# Patient Record
Sex: Male | Born: 1980 | Hispanic: Yes | Marital: Single | Smoking: Never smoker
Health system: Southern US, Community
[De-identification: ages and names within clinical notes are randomized; demographics above are authoritative.]

---

## 2017-09-02 ENCOUNTER — Emergency Department (HOSPITAL_COMMUNITY)
Admission: EM | Admit: 2017-09-02 | Discharge: 2017-09-02 | Disposition: A | Discharge status: Home / Self Care | Payer: Self-pay | Attending: Emergency Medicine | Admitting: Emergency Medicine

## 2017-09-02 ENCOUNTER — Encounter (HOSPITAL_COMMUNITY): Payer: Self-pay | Admitting: Cardiology

## 2017-09-02 ENCOUNTER — Emergency Department (HOSPITAL_COMMUNITY): Payer: Self-pay

## 2017-09-02 DIAGNOSIS — K2950 Unspecified chronic gastritis without bleeding: Secondary | ICD-10-CM | POA: Insufficient documentation

## 2017-09-02 LAB — CBC
HEMATOCRIT: 44.1 % (ref 39.0–52.0)
Hemoglobin: 14.6 g/dL (ref 13.0–17.0)
MCH: 31.3 pg (ref 26.0–34.0)
MCHC: 33.1 g/dL (ref 30.0–36.0)
MCV: 94.4 fL (ref 78.0–100.0)
Platelets: 204 10*3/uL (ref 150–400)
RBC: 4.67 MIL/uL (ref 4.22–5.81)
RDW: 12.6 % (ref 11.5–15.5)
WBC: 6 10*3/uL (ref 4.0–10.5)

## 2017-09-02 LAB — COMPREHENSIVE METABOLIC PANEL
ALBUMIN: 4.6 g/dL (ref 3.5–5.0)
ALT: 29 U/L (ref 17–63)
AST: 26 U/L (ref 15–41)
Alkaline Phosphatase: 70 U/L (ref 38–126)
Anion gap: 13 (ref 5–15)
BILIRUBIN TOTAL: 0.8 mg/dL (ref 0.3–1.2)
BUN: 17 mg/dL (ref 6–20)
CHLORIDE: 94 mmol/L — AB (ref 101–111)
CO2: 24 mmol/L (ref 22–32)
Calcium: 9.2 mg/dL (ref 8.9–10.3)
Creatinine, Ser: 0.95 mg/dL (ref 0.61–1.24)
GFR calc Af Amer: >60 mL/min (ref >60)
GFR calc non Af Amer: >60 mL/min (ref >60)
GLUCOSE: 124 mg/dL — AB (ref 65–99)
POTASSIUM: 3.8 mmol/L (ref 3.5–5.1)
Sodium: 131 mmol/L — ABNORMAL LOW (ref 135–145)
TOTAL PROTEIN: 7.8 g/dL (ref 6.5–8.1)

## 2017-09-02 LAB — URINALYSIS, ROUTINE W REFLEX MICROSCOPIC
Bilirubin Urine: NEGATIVE
Glucose, UA: 50 mg/dL — AB
Hgb urine dipstick: NEGATIVE
Ketones, ur: 20 mg/dL — AB
Leukocytes, UA: NEGATIVE
NITRITE: NEGATIVE
PH: 5 (ref 5.0–8.0)
Protein, ur: NEGATIVE mg/dL
SPECIFIC GRAVITY, URINE: 1.025 (ref 1.005–1.030)

## 2017-09-02 LAB — LIPASE, BLOOD: LIPASE: 28 U/L (ref 11–51)

## 2017-09-02 MED ORDER — ONDANSETRON HCL 4 MG/2ML IJ SOLN
4.0000 mg | Freq: Once | INTRAMUSCULAR | Status: AC
  Administered 2017-09-02: 4 mg via INTRAVENOUS
  Filled 2017-09-02: qty 2

## 2017-09-02 MED ORDER — IOPAMIDOL (ISOVUE-300) INJECTION 61%
100.0000 mL | Freq: Once | INTRAVENOUS | Status: AC | PRN
  Administered 2017-09-02: 100 mL via INTRAVENOUS

## 2017-09-02 MED ORDER — RANITIDINE HCL 150 MG PO TABS: 150.0000 mg | ORAL_TABLET | Freq: Two times a day (BID) | ORAL | 0 refills | Status: AC

## 2017-09-02 MED ORDER — PANTOPRAZOLE SODIUM 40 MG PO TBEC
40.0000 mg | DELAYED_RELEASE_TABLET | Freq: Once | ORAL | Status: AC
  Administered 2017-09-02: 40 mg via ORAL
  Filled 2017-09-02: qty 1

## 2017-09-02 MED ORDER — FAMOTIDINE 20 MG PO TABS
20.0000 mg | ORAL_TABLET | Freq: Once | ORAL | Status: AC
  Administered 2017-09-02: 20 mg via ORAL
  Filled 2017-09-02: qty 1

## 2017-09-02 MED ORDER — SODIUM CHLORIDE 0.9 % IV SOLN
1000.0000 mL | INTRAVENOUS | Status: DC
  Administered 2017-09-02: 1000 mL via INTRAVENOUS

## 2017-09-02 MED ORDER — ESOMEPRAZOLE MAGNESIUM 40 MG PO CPDR: 40.0000 mg | DELAYED_RELEASE_CAPSULE | Freq: Every day | ORAL | 0 refills | Status: AC

## 2017-09-02 MED ORDER — SODIUM CHLORIDE 0.9 % IV BOLUS (SEPSIS)
1000.0000 mL | Freq: Once | INTRAVENOUS | Status: AC
  Administered 2017-09-02: 1000 mL via INTRAVENOUS

## 2017-09-02 NOTE — Discharge Instructions (Signed)
Your urine test reveals some sugar in your urine.  Please see the your primary physician, [facility] will [facility] to have [facility] ret[facility].  The CT scan of your abdomen suggest irritation of the stomach lining, possibly consistent with gastritis.  Please use Zantac 150 mg 2 times daily.  Use Nexium 40mg  daily until seen by the stomach specialist.(Both of these medications are available over-the-counter in generic form)

## 2017-09-02 NOTE — ED Notes (Signed)
Pt states his pain is the same.  States his nausea is better.

## 2017-09-02 NOTE — ED Provider Notes (Signed)
[facility] EMERGENCY DEPARTMENT Provider Note   CSN: [id number] Arrival date & time: 09/02/17  1514     History   Chief Complaint Chief Complaint  Patient presents with  . Abdominal Pain    HPI Prabhav Faulkenberry is a 37 y.o. male.  Patient is a 37 year old male who presents to the emergency department with a complaint of abdominal pain and vomiting.  The patient states that he was awakened this morning about 4 AM with mid abdomen area pain.  He went to work, but continued to have pain.  He tried Alka-Seltzer, as well as ibuprofen but continued to have problems with pain.  He tried getting a little bit of soda to stay down, but it would not stay down.  He states that he has had probably 3 or 4 episodes of vomiting.  He also feels that he has had fever and chills, but he is unsure as to how much with temperature change she has had.  He is not had any recent changes in his diet.  He is not had any changes in his medicine.  He is not been out of the country recently.  He does state that his baby is sick, but no one else at home has been ill recently.      History reviewed. No pertinent past medical history.  There are no active problems to display for this patient.   History reviewed. No pertinent surgical history.     Home Medications    Prior to Admission medications   Not on File    Family History History reviewed. No pertinent family history.  Social History Social History   Tobacco Use  . Smoking status: Never Smoker  . Smokeless tobacco: Never Used  Substance Use Topics  . Alcohol use: Yes    Frequency: Never    Comment: occasional   . Drug use: No     Allergies   Patient has no known allergies.   Review of Systems Review of Systems  Constitutional: Positive for activity change, appetite change, chills, fatigue and fever.       All ROS Neg except as noted in HPI  HENT: Negative for nosebleeds.   Eyes: Negative for photophobia and discharge.   Respiratory: Negative for cough, shortness of breath and wheezing.   Cardiovascular: Negative for chest pain and palpitations.  Gastrointestinal: Positive for abdominal pain, nausea and vomiting. Negative for blood in stool and diarrhea.  Genitourinary: Negative for dysuria, frequency and hematuria.  Musculoskeletal: Negative for arthralgias, back pain and neck pain.  Skin: Negative.   Neurological: Negative for dizziness, seizures and speech difficulty.  Psychiatric/Behavioral: Negative for confusion and hallucinations.     Physical Exam Updated Vital Signs BP 127/80   Pulse (!) 56   Temp 98.3 F (36.8 C) (Oral)   Resp 18   Wt 69.9 kg (154 lb)   SpO2 99%   Physical Exam  Constitutional: He is oriented to person, place, and time. Vital signs are normal. He appears well-developed and well-nourished. He is active.  Non-toxic appearance.  HENT:  Head: Normocephalic and atraumatic.  Right Ear: Tympanic membrane, external ear and ear canal normal.  Left Ear: Tympanic membrane, external ear and ear canal normal.  Nose: Nose normal.  Mouth/Throat: Uvula is midline, oropharynx is clear and moist and mucous membranes are normal.  Eyes: Conjunctivae, EOM and lids are normal. Pupils are equal, round, and reactive to light.  Neck: Trachea normal, normal range of motion and phonation normal.  Neck supple. Carotid bruit is not present.  Cardiovascular: Normal rate, regular rhythm, normal heart sounds, intact distal pulses and normal pulses.  Pulmonary/Chest: Breath sounds normal. No respiratory distress.  Abdominal: Soft. Normal appearance and bowel sounds are normal. He exhibits no distension, no ascites and no mass. There is no splenomegaly or hepatomegaly. There is generalized tenderness. There is no rigidity, no guarding, no CVA tenderness, no tenderness at McBurney's point and negative Murphy's sign.  Musculoskeletal: Normal range of motion.  Lymphadenopathy:       Head (right side): No  submental, no submandibular, no preauricular and no posterior auricular adenopathy present.       Head (left side): No submental, no submandibular, no preauricular and no posterior auricular adenopathy present.    He has no cervical adenopathy.  Neurological: He is alert and oriented to person, place, and time. He has normal strength. No cranial nerve deficit or sensory deficit. GCS eye subscore is 4. GCS verbal subscore is 5. GCS motor subscore is 6.  Skin: Skin is warm and dry.  Psychiatric: He has a normal mood and affect. His speech is normal.  Nursing note and vitals reviewed.    ED Treatments / Results  Labs (all labs ordered are listed, but only abnormal results are displayed) Labs Reviewed  COMPREHENSIVE METABOLIC PANEL - Abnormal; Notable for the following components:      Result Value   Sodium 131 (*)    Chloride 94 (*)    Glucose, Bld 124 (*)    All other components within normal limits  LIPASE, BLOOD  CBC  URINALYSIS, ROUTINE W REFLEX MICROSCOPIC    EKG  EKG Interpretation None       Radiology No results found.  Procedures Procedures (including critical care time)  Medications Ordered in ED Medications  sodium chloride 0.9 % bolus 1,000 mL (not administered)    Followed by  0.9 %  sodium chloride infusion (not administered)  ondansetron (ZOFRAN) injection 4 mg (not administered)     Initial Impression / Assessment and Plan / ED Course  I have reviewed the triage vital signs and the nursing notes.  Pertinent labs & imaging results that were available during my care of the patient were reviewed by me and considered in my medical decision making (see chart for details).       Final Clinical Impressions(s) / ED Diagnoses MDM  Vital signs are within normal limits.   Patient has been unable to keep liquids down today.  IV fluids and IV Zofran ordered.  Lipase is normal.  Liver function tests are normal.  Renal function test are normal.  Sodium is  slightly low at 131 and chloride is slightly low at 94.  Anion gap is normal at 13.  Complete blood count is well within normal limits.  Urinalysis shows a clear yellow specimen with a specific gravity 1.025.  The glucose is elevated at 50, ketones elevated at 20, otherwise the urine is within normal limits.  I have asked the patient to see his primary physician for recheck of his glucose and his urine.  Patient states he does not have a history of diabetes.  Patient states that his nausea feels better, but he continues to have epigastric area pain.  Recheck shows that he now has pain to palpation in the epigastric area.  Will check a CT scan to rule out causes of abdominal pain including abnormality of the pancreas, gastritis,  obstruction, mass, signs of colitis, or surgical abdomen.  CT scan shows some thickening of the gastric wall consistent with gastritis.  Patient will be treated with Zantac and Nexium.  The patient states that he smokes occasionally and he uses alcohol on a regular basis.  I have asked him to stop both of these to help with his abdominal pain.  Patient is also referred to GI specialist for evaluation if not improving.  Patient is in agreement with this plan.   Final diagnoses:  Chronic gastritis without bleeding, unspecified gastritis type    ED Discharge Orders        Ordered    ranitidine (ZANTAC) 150 MG tablet  2 times daily     09/02/17 2222    esomeprazole (NEXIUM) 40 MG capsule  Daily     09/02/17 2222       Ivery Quale, PA-C 09/02/17 2228    Doug Sou, MD 09/02/17 2330

## 2017-09-02 NOTE — ED Triage Notes (Signed)
Vomiting and upper abdominal pain since this morning

## 2017-09-24 ENCOUNTER — Ambulatory Visit
Admission: RE | Admit: 2017-09-24 | Discharge: 2017-09-24 | Disposition: A | Discharge status: Home / Self Care | Payer: Worker's Compensation | Source: Ambulatory Visit | Attending: Physician Assistant | Admitting: Physician Assistant

## 2017-09-24 ENCOUNTER — Other Ambulatory Visit: Payer: Self-pay | Admitting: Physician Assistant

## 2017-09-24 DIAGNOSIS — S61012A Laceration without foreign body of left thumb without damage to nail, initial encounter: Secondary | ICD-10-CM | POA: Diagnosis not present

## 2017-09-24 DIAGNOSIS — W3189XA Contact with other specified machinery, initial encounter: Secondary | ICD-10-CM | POA: Insufficient documentation

## 2017-09-24 DIAGNOSIS — M79645 Pain in left finger(s): Secondary | ICD-10-CM | POA: Insufficient documentation

## 2018-08-31 IMAGING — CT CT ABD-PELV W/ CM
2 of 4 series · 16 of 46 positions shown, 18 images · IV contrast (Isovue)
Comparison: None.

CLINICAL DATA: Epigastric pain today.  Vomiting.

EXAM:
CT ABDOMEN AND PELVIS WITH CONTRAST
TECHNIQUE: Multidetector CT imaging of the abdomen and pelvis was performed
using the standard protocol following bolus administration of
intravenous contrast.
CONTRAST:  100mL 57W5P1-MJJ IOPAMIDOL (57W5P1-MJJ) INJECTION 61%

[Series 2: axial st · axial · 0.59mm/px · z∈[+757,+1172]mm · 13 of 91 slices shown, 15 images]
[im 4/91  soft-tissue]
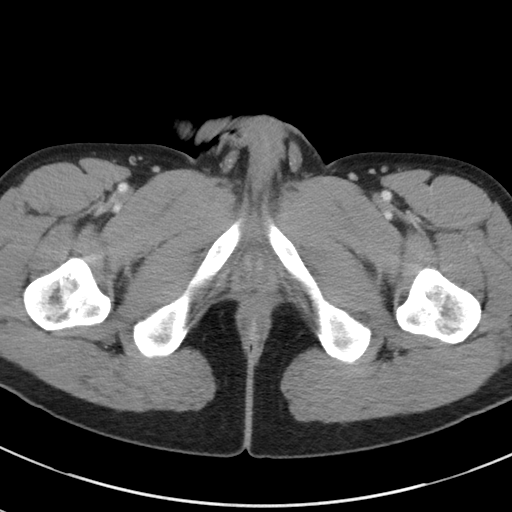
[im 4/91  bone]
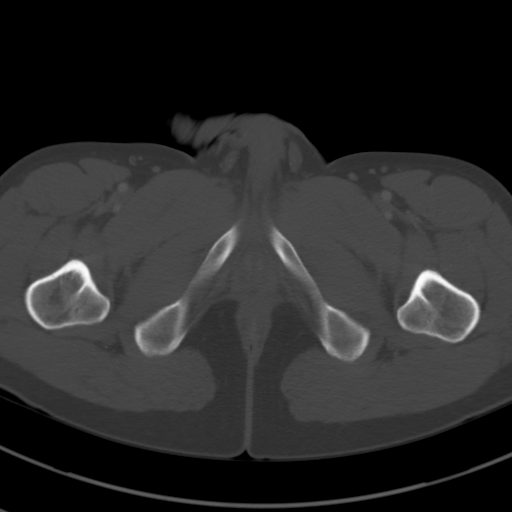
[im 12/91  soft-tissue]
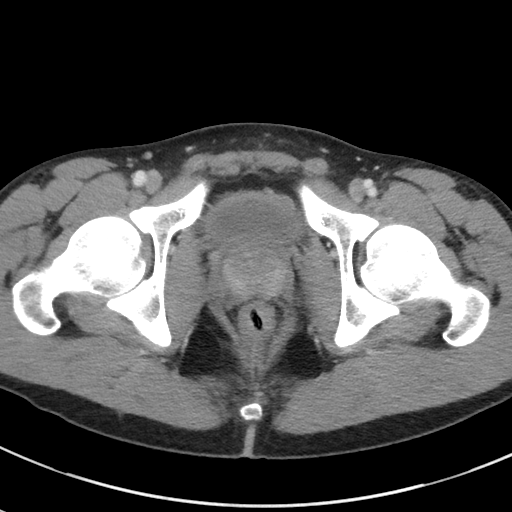
[im 19/91  soft-tissue]
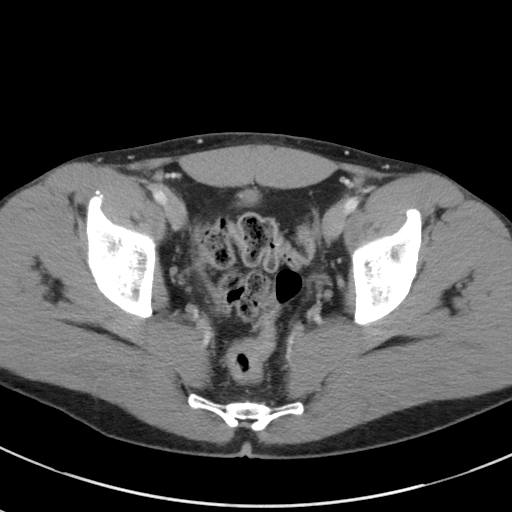
[im 27/91  soft-tissue]
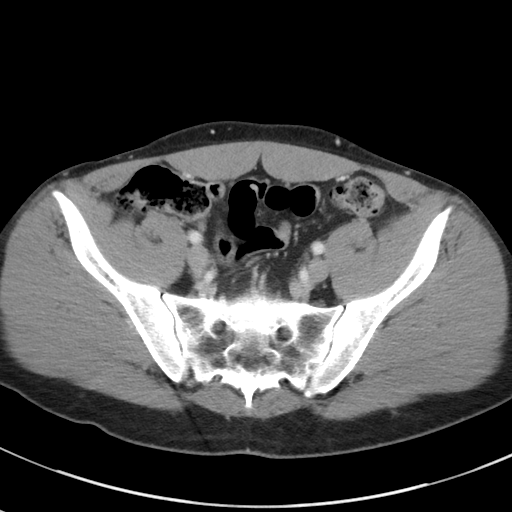
[im 31/91  soft-tissue]
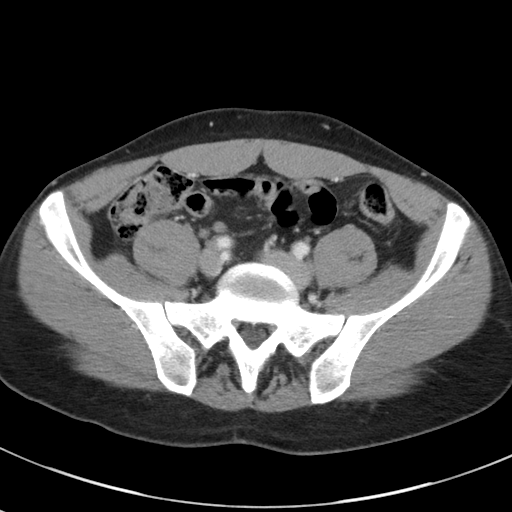
[im 38/91  soft-tissue]
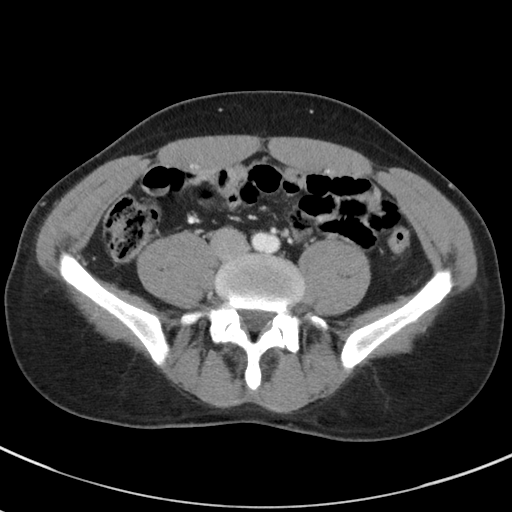
[im 46/91  soft-tissue]
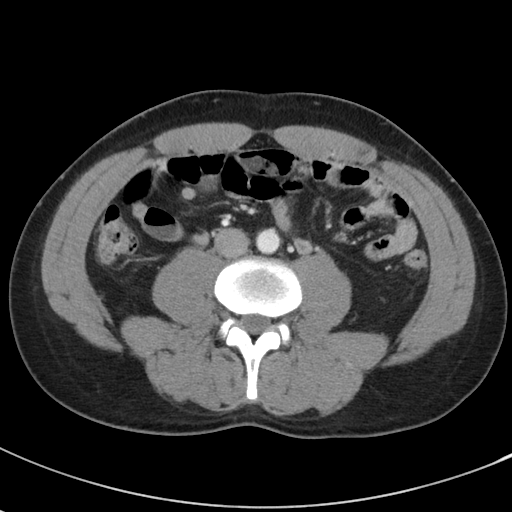
[im 53/91  soft-tissue]
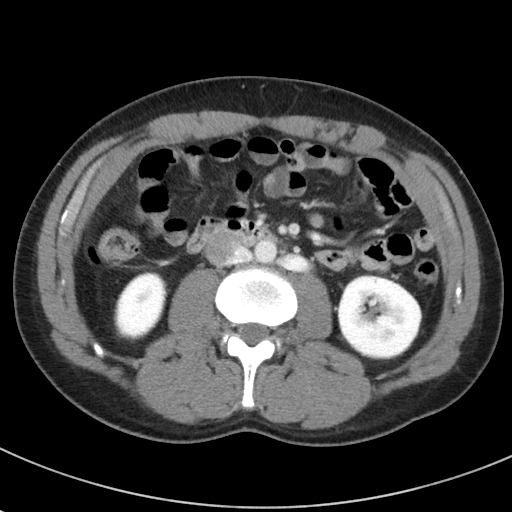
[im 61/91  soft-tissue]
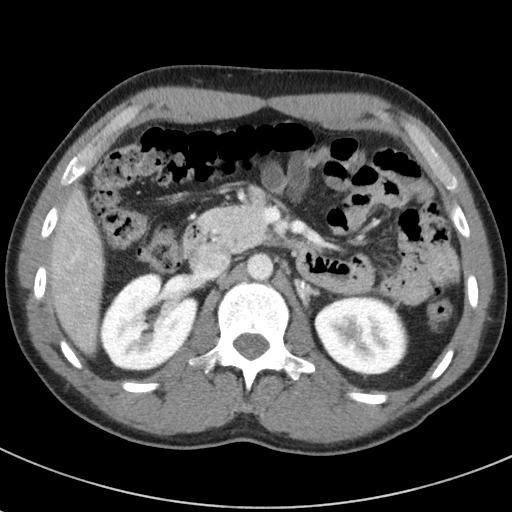
[im 61/91  bone]
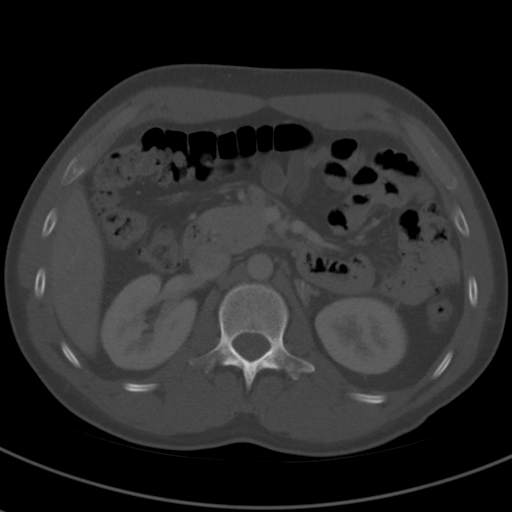
[im 64/91  soft-tissue]
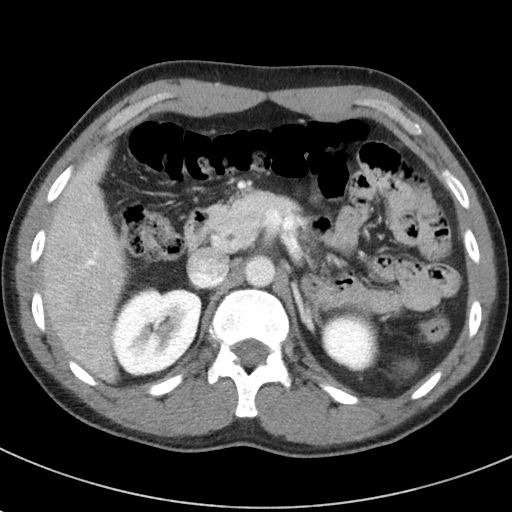
[im 72/91  soft-tissue]
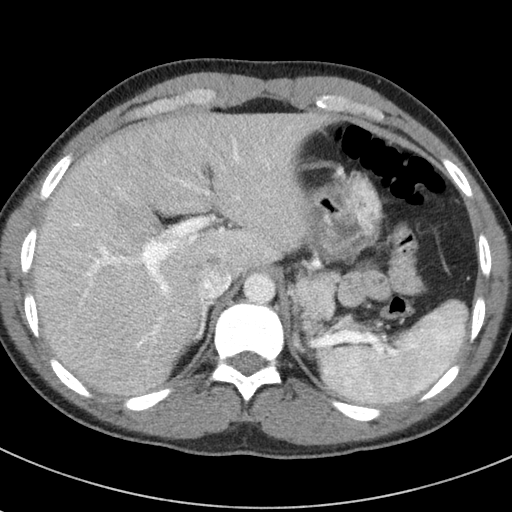
[im 79/91  soft-tissue]
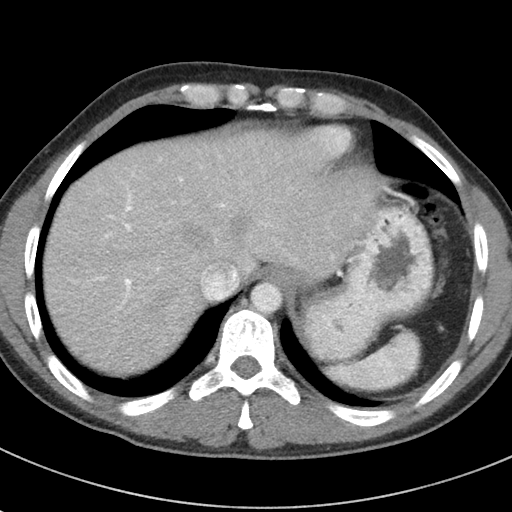
[im 87/91  soft-tissue]
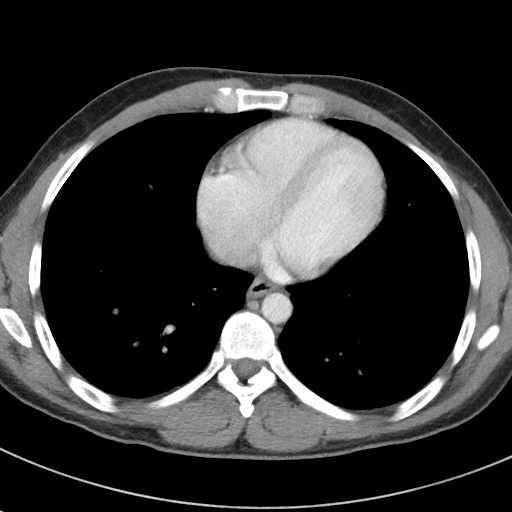

[Series 5: coronal st · coronal · 0.79mm/px · 3 of 66 slices shown]
[im 22/66  soft-tissue]
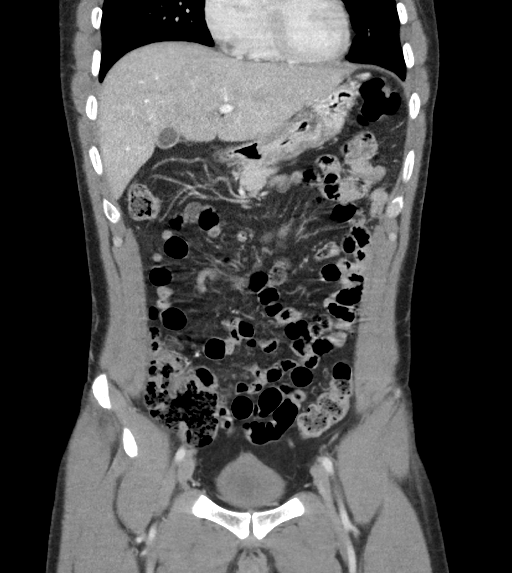
[im 29/66  soft-tissue]
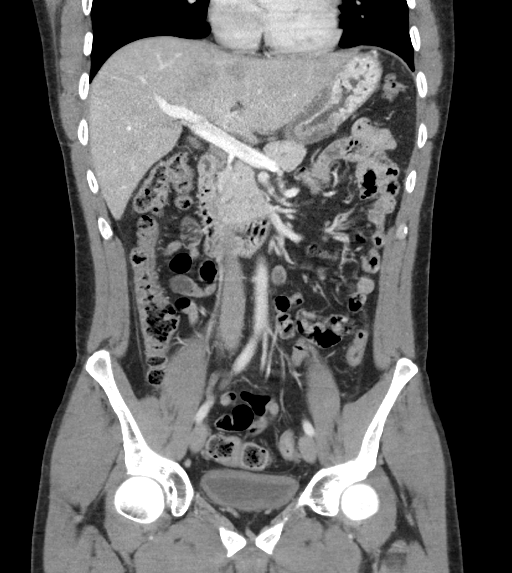
[im 37/66  soft-tissue]
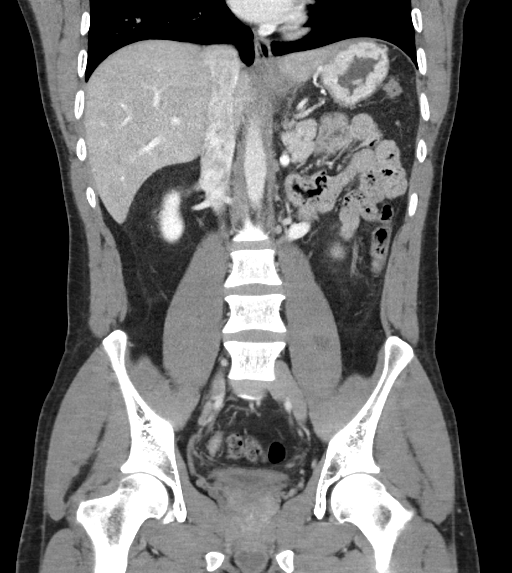

[16 of 46 positions shown; findings below may reference images not displayed]

FINDINGS: Lower chest: Minimal hypoventilatory atelectasis in the lung bases.

Hepatobiliary: Mild focal fatty infiltration adjacent to the
falciform ligament. No focal lesion. Gallbladder physiologically
distended, no calcified stone. No biliary dilatation.

Pancreas: No ductal dilatation or inflammation.

Spleen: Normal in size without focal abnormality.

Adrenals/Urinary Tract: Adrenal glands are unremarkable. Kidneys are
normal, without renal calculi, focal lesion, or hydronephrosis.
Bladder is unremarkable for degree of distension.

Stomach/Bowel: Stomach is nondistended with equivocal gastric wall
thickening versus nondistention. Appendix appears normal, for
example image 62 series 2. No evidence of bowel wall thickening,
distention, or inflammatory changes.

Vascular/Lymphatic: Retroaortic left renal vein, incidental. No
other vascular abnormality. No enlarged abdominal or pelvic lymph
nodes.

Reproductive: Few right prostatic calcifications.

Other: No free air, free fluid, or intra-abdominal fluid collection.

Musculoskeletal: Small bone island in the left pelvis. There are no
acute or suspicious osseous abnormalities.
IMPRESSION: 1. Equivocal mild gastric wall thickening which may reflect
gastritis in the setting of epigastric pain and vomiting.
2. Otherwise no acute abnormality or explanation for symptoms.

## 2018-09-22 IMAGING — CR DG FINGER THUMB 2+V*L*
3 series · 3 of 3 positions shown · non-contrast
Comparison: None.

CLINICAL DATA: Laceration to the thumb femoral grinder.

EXAM:
LEFT THUMB 2+V

[finger ap]
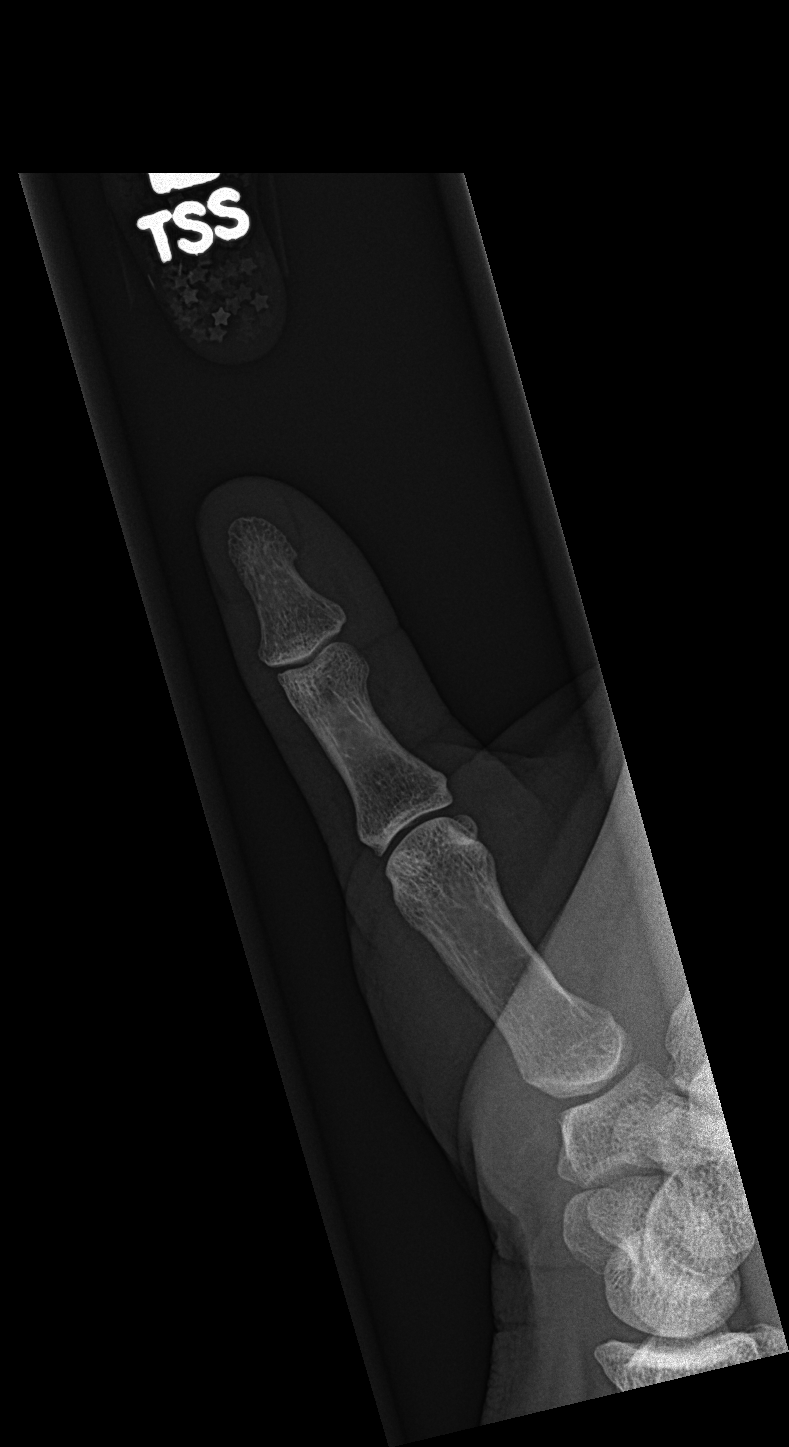

[finger obl]
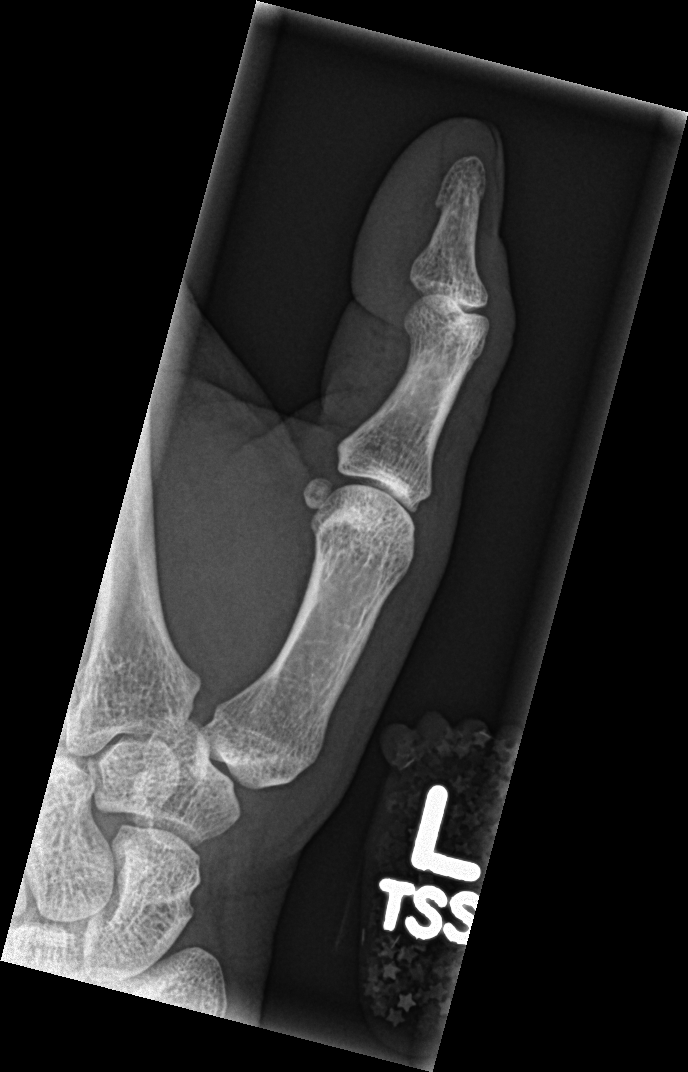

[finger lat]
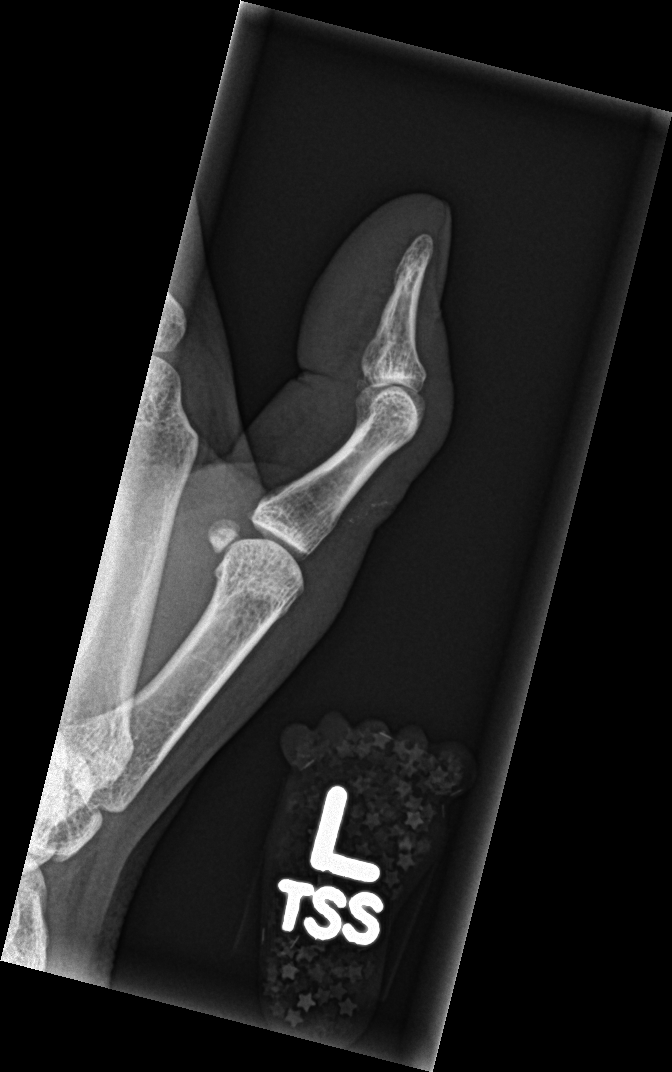

[3 of 3 positions shown; findings below may reference images not displayed]

FINDINGS: Soft tissue laceration over the dorsum of the left thumb at the
level of the mid proximal phalanx is noted without underlying
osseous involvement. Soft tissue debris is seen in the expected
location of the laceration. Joint spaces are intact without
malalignment.
IMPRESSION: Soft tissue laceration with debris along the dorsum of the left
thumb at the level of the mid proximal phalanx. No underlying
osseous involvement.
